# Patient Record
Sex: Male | Born: 1995 | Race: White | Hispanic: No | Marital: Single | Smoking: Current every day smoker
Health system: Southern US, Community
[De-identification: ages and names within clinical notes are randomized; demographics above are authoritative.]

## PROBLEM LIST (undated history)

## (undated) DIAGNOSIS — F909 Attention-deficit hyperactivity disorder, unspecified type: Secondary | ICD-10-CM

## (undated) DIAGNOSIS — F209 Schizophrenia, unspecified: Secondary | ICD-10-CM

## (undated) DIAGNOSIS — F32A Depression, unspecified: Secondary | ICD-10-CM

## (undated) DIAGNOSIS — F329 Major depressive disorder, single episode, unspecified: Secondary | ICD-10-CM

---
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
## 2015-07-17 ENCOUNTER — Ambulatory Visit (HOSPITAL_COMMUNITY)
Admission: RE | Admit: 2015-07-17 | Discharge: 2015-07-17 | Disposition: A | Discharge status: Home / Self Care | Payer: BLUE CROSS/BLUE SHIELD | Source: Ambulatory Visit | Attending: Family Medicine | Admitting: Family Medicine

## 2015-07-17 ENCOUNTER — Other Ambulatory Visit (HOSPITAL_COMMUNITY): Payer: Self-pay | Admitting: Family Medicine

## 2015-07-17 DIAGNOSIS — M542 Cervicalgia: Secondary | ICD-10-CM | POA: Diagnosis present

## 2015-07-17 DIAGNOSIS — M546 Pain in thoracic spine: Secondary | ICD-10-CM | POA: Diagnosis not present

## 2015-07-17 DIAGNOSIS — M79601 Pain in right arm: Secondary | ICD-10-CM | POA: Diagnosis not present

## 2015-07-17 DIAGNOSIS — M79602 Pain in left arm: Secondary | ICD-10-CM | POA: Insufficient documentation

## 2016-06-28 IMAGING — CR DG THORACIC SPINE 3V
3 series · 3 of 3 positions shown · non-contrast
Comparison: PA and lateral chest x-ray September 28, 2014

CLINICAL DATA: Neck and upper thoracic spine pain for the past
month with discomfort in both arms without known injury

EXAM:
THORACIC SPINE - 3 VIEWS

[view not recorded (1 of 3)]
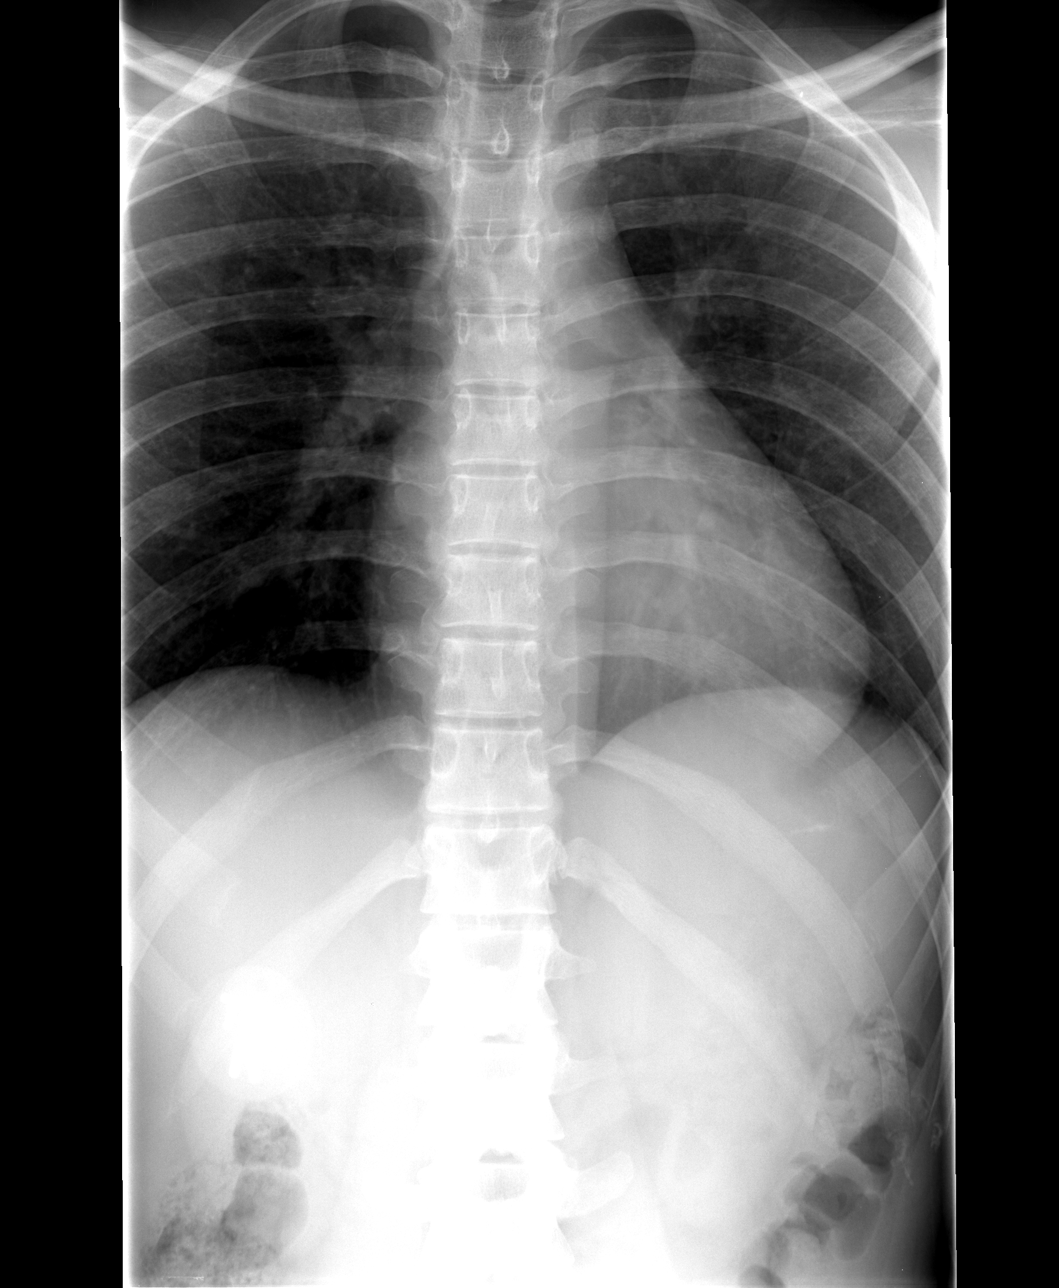

[view not recorded (2 of 3)]
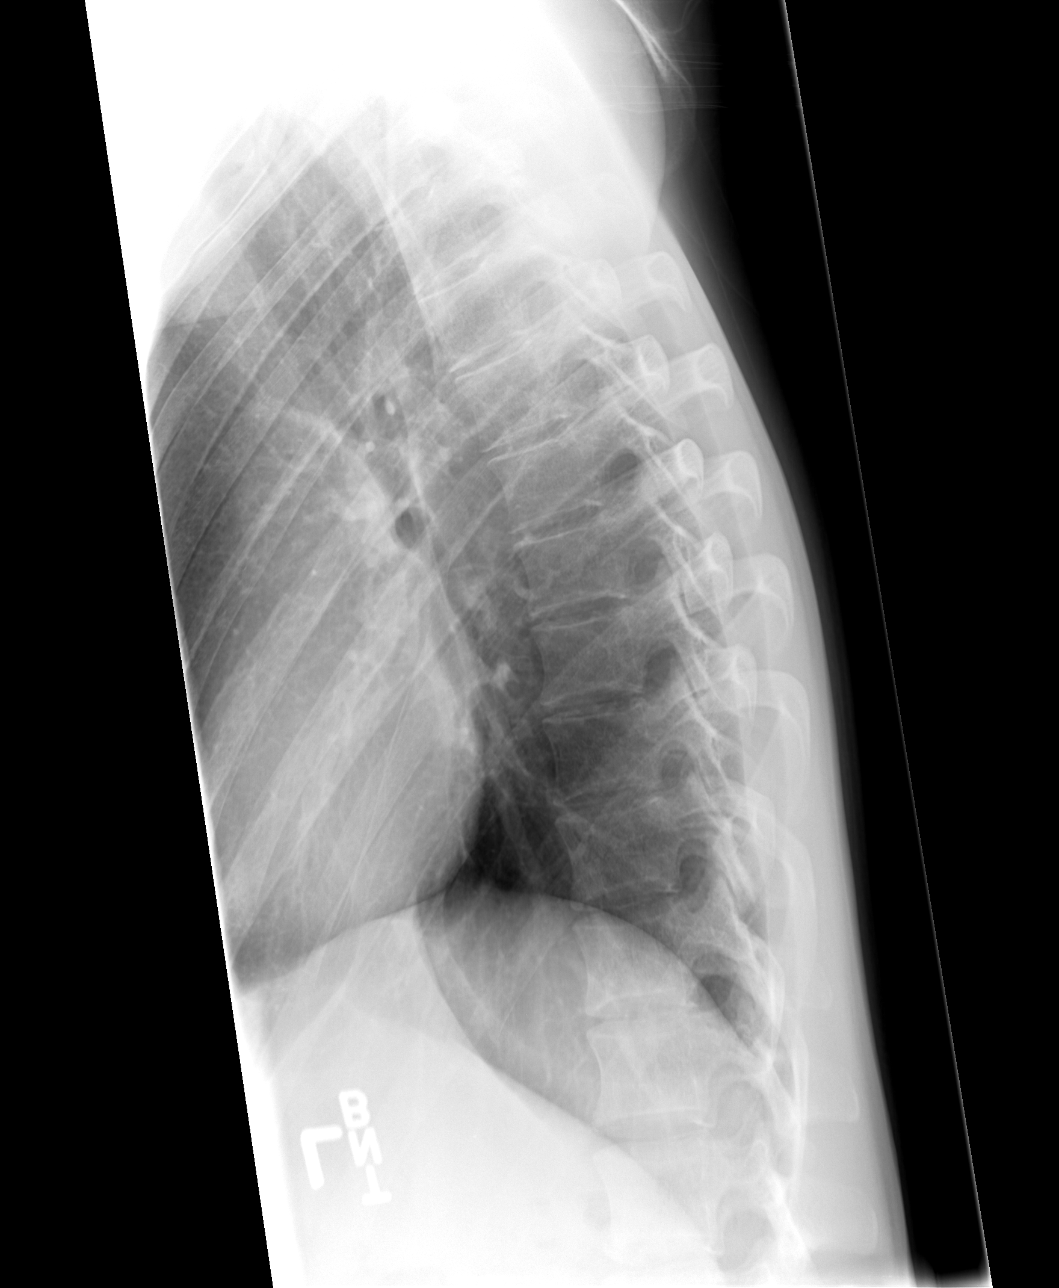

[view not recorded (3 of 3)]
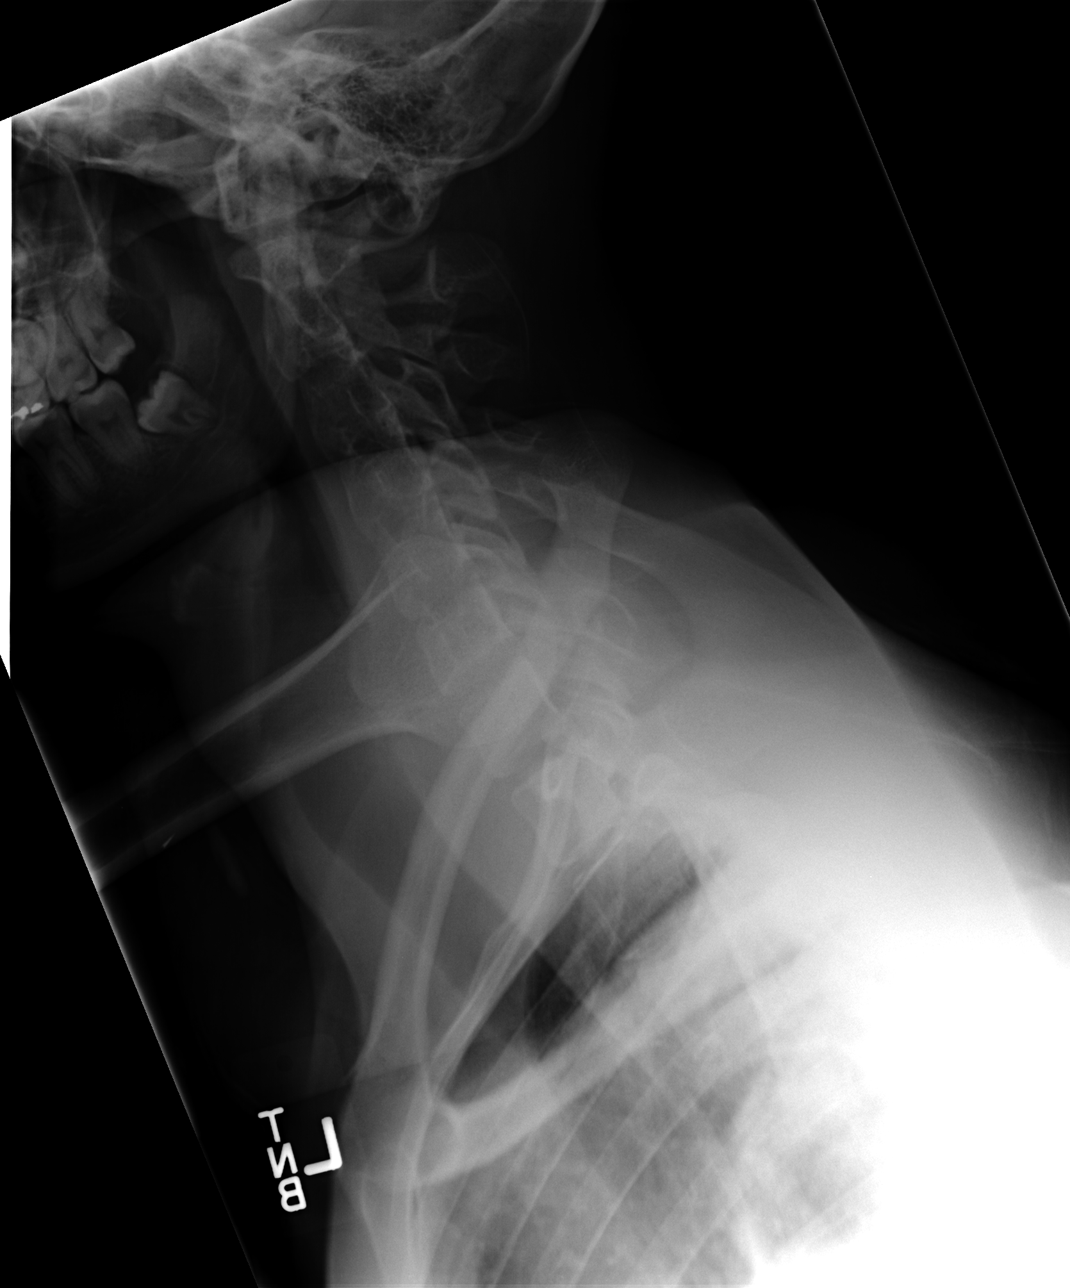

[3 of 3 positions shown; findings below may reference images not displayed]

FINDINGS: The thoracic vertebral bodies are preserved in height. The disc
space heights are well maintained. There are no abnormal
paravertebral soft tissue densities. The pedicles appear intact.
IMPRESSION: There is no acute or significant chronic bony abnormality of the
thoracic spine.

## 2019-03-02 ENCOUNTER — Other Ambulatory Visit: Payer: Self-pay

## 2019-03-02 ENCOUNTER — Emergency Department (HOSPITAL_COMMUNITY)
Admission: EM | Admit: 2019-03-02 | Discharge: 2019-03-02 | Disposition: A | Discharge status: Home / Self Care | Payer: BLUE CROSS/BLUE SHIELD | Attending: Emergency Medicine | Admitting: Emergency Medicine

## 2019-03-02 ENCOUNTER — Encounter (HOSPITAL_COMMUNITY): Payer: Self-pay | Admitting: Emergency Medicine

## 2019-03-02 DIAGNOSIS — F419 Anxiety disorder, unspecified: Secondary | ICD-10-CM | POA: Diagnosis present

## 2019-03-02 DIAGNOSIS — R110 Nausea: Secondary | ICD-10-CM | POA: Insufficient documentation

## 2019-03-02 DIAGNOSIS — R064 Hyperventilation: Secondary | ICD-10-CM | POA: Diagnosis not present

## 2019-03-02 DIAGNOSIS — F410 Panic disorder [episodic paroxysmal anxiety] without agoraphobia: Secondary | ICD-10-CM | POA: Diagnosis not present

## 2019-03-02 DIAGNOSIS — R0602 Shortness of breath: Secondary | ICD-10-CM | POA: Diagnosis not present

## 2019-03-02 DIAGNOSIS — F17290 Nicotine dependence, other tobacco product, uncomplicated: Secondary | ICD-10-CM | POA: Diagnosis not present

## 2019-03-02 HISTORY — DX: Schizophrenia, unspecified: F20.9

## 2019-03-02 HISTORY — DX: Attention-deficit hyperactivity disorder, unspecified type: F90.9

## 2019-03-02 HISTORY — DX: Depression, unspecified: F32.A

## 2019-03-02 HISTORY — DX: Major depressive disorder, single episode, unspecified: F32.9

## 2019-03-02 NOTE — ED Triage Notes (Signed)
[facility] - pt was at work at [employer], was brought in due to having anxiety and a schizophrenic episode. States these are brought on when highly stressed, doesn't take medications

## 2019-03-02 NOTE — Discharge Instructions (Signed)
Return here if you develop any return of your symptoms.  You might also benefit from outpatient counseling to help you with these episodes - please see the following list, especially the starred providers if you are interested.

## 2019-03-03 NOTE — ED Provider Notes (Signed)
[facility] EMERGENCY DEPARTMENT Provider Note   CSN: [id number] Arrival date & time: 03/02/19  2144    History   Chief Complaint Chief Complaint  Patient presents with  . Anxiety    HPI Christian Beck is a 23 y.o. male presenting for evaluation of a suspected panic attack while at work tonight.  He endorses history of schizophrenia and has episodes of mild panic daily which he can control usually, but tonight had 2 back to back episodes of escalating panic including hyperventilation and several episodes of emesis.He went outside to avoid vomiting in his workplace and was rolling on the ground, feeling not in control of his body during the episode. He recalls these details and denies seizure.  He states when it gets this far, he knows the 3rd episode will "push me over into a bad place" so he requested to be transported here.  He was at his job at [employer] when this occurred. Since arriving here, he feels much improved, almost to baseline, just endorses fatigue.  He refuses any medications for his condition, stating he tried that once and hated not being able to feel anything. He denies auditory or visual hallucinations, also denies homicidal or suicidal ideation.  He does not have a pcp nor a mental health provider.     The history is provided by the patient.    Past Medical History:  Diagnosis Date  . ADHD   . Depression   . Schizophrenia (HCC)     There are no active problems to display for this patient.   History reviewed. No pertinent surgical history.      Home Medications    Prior to Admission medications   Not on File    Family History No family history on file.  Social History Social History   Tobacco Use  . Smoking status: Current Every Day Smoker    Packs/day: 1.00    Types: Pipe  . Smokeless tobacco: Never Used  Substance Use Topics  . Alcohol use: Yes    Comment: occ  . Drug use: Not on file    Comment: CBD     Allergies   Latex   Review  of Systems Review of Systems  Constitutional: Positive for fatigue. Negative for fever.  HENT: Negative for congestion and sore throat.   Eyes: Negative.   Respiratory: Positive for shortness of breath. Negative for chest tightness.   Cardiovascular: Negative for chest pain.  Gastrointestinal: Positive for vomiting. Negative for abdominal pain and nausea.  Genitourinary: Negative.   Musculoskeletal: Negative for arthralgias, joint swelling and neck pain.  Skin: Negative.  Negative for rash and wound.  Neurological: Negative for dizziness, tremors, seizures, syncope, weakness, light-headedness, numbness and headaches.  Psychiatric/Behavioral: Negative for confusion, hallucinations, self-injury and suicidal ideas. The patient is nervous/anxious.      Physical Exam Updated Vital Signs BP 110/71   Pulse 76   Temp 98.4 F (36.9 C) (Oral)   Resp (!) 23   Ht 5\' 4"  (1.626 m)   Wt 48.5 kg   SpO2 100%   BMI 18.37 kg/m   Physical Exam Vitals signs and nursing note reviewed.  Constitutional:      General: He is not in acute distress.    Appearance: Normal appearance. He is well-developed. He is not toxic-appearing or diaphoretic.  HENT:     Head: Normocephalic and atraumatic.  Eyes:     Extraocular Movements: Extraocular movements intact.     Conjunctiva/sclera: Conjunctivae normal.  Pupils: Pupils are equal, round, and reactive to light.  Neck:     Musculoskeletal: Normal range of motion.  Cardiovascular:     Rate and Rhythm: Normal rate and regular rhythm.     Heart sounds: Normal heart sounds.  Pulmonary:     Effort: Pulmonary effort is normal.     Breath sounds: Normal breath sounds. No wheezing.  Abdominal:     General: Bowel sounds are normal.     Palpations: Abdomen is soft.     Tenderness: There is no abdominal tenderness.  Musculoskeletal: Normal range of motion.  Skin:    General: Skin is warm and dry.  Neurological:     Mental Status: He is alert and oriented  to person, place, and time.  Psychiatric:        Attention and Perception: Attention and perception normal. He does not perceive auditory or visual hallucinations.        Mood and Affect: Mood and affect normal.        Speech: Speech normal. Speech is not rapid and pressured or slurred.        Behavior: Behavior normal. Behavior is cooperative.        Thought Content: Thought content normal. Thought content is not paranoid or delusional. Thought content does not include homicidal or suicidal ideation.        Cognition and Memory: Cognition normal.     Comments: Pt conversive and well spoken, appears to have good insight into his condition.  He has good eye contact. No evidence of external stimuli.        ED Treatments / Results  Labs (all labs ordered are listed, but only abnormal results are displayed) Labs Reviewed - No data to display  EKG EKG Interpretation  Date/Time:  Friday March 02 2019 21:57:57 EDT Ventricular Rate:  83 PR Interval:    QRS Duration: 116 QT Interval:  407 QTC Calculation: 479 R Axis:   88 Text Interpretation:  Sinus rhythm Incomplete right bundle branch block ST elev, probable normal early repol pattern similar to prior 1/10 Confirmed by Meridee Score [phone]) on 03/02/2019 10:07:15 PM   Radiology No results found.  Procedures Procedures (including critical care time)  Medications Ordered in ED Medications - No data to display   Initial Impression / Assessment and Plan / ED Course  I have reviewed the triage vital signs and the nursing notes.  Pertinent labs & imaging results that were available during my care of the patient were reviewed by me and considered in my medical decision making (see chart for details).       Pt with hx suggesting panic attack, now resolved.  No red flag sx on exam or with history suggesting need for [facility] eval or any medical testing.  He was felt to be at his baseline prior to dc.  Return precautions discussed. Also  given list of community mental health providers for pt to explore. Prn f/u anticipated.  Final Clinical Impressions(s) / ED Diagnoses   Final diagnoses:  Panic attack    ED Discharge Orders    None       Victoriano Lain 03/03/19 1318    Terrilee Files, MD 03/04/19 1059
